# Patient Record
Sex: Male | Born: 1975 | Race: White | Hispanic: No | Smoking: Current every day smoker
Health system: Southern US, Community
[De-identification: ages and names within clinical notes are randomized; demographics above are authoritative.]

---

## 2004-12-06 ENCOUNTER — Emergency Department (HOSPITAL_COMMUNITY): Admission: EM | Admit: 2004-12-06 | Discharge: 2004-12-06 | Payer: Self-pay | Admitting: Emergency Medicine

## 2005-05-23 ENCOUNTER — Emergency Department (HOSPITAL_COMMUNITY): Admission: EM | Admit: 2005-05-23 | Discharge: 2005-05-24 | Payer: Self-pay | Admitting: Emergency Medicine

## 2005-05-24 ENCOUNTER — Ambulatory Visit: Payer: Self-pay | Admitting: Psychiatry

## 2005-05-24 ENCOUNTER — Inpatient Hospital Stay (HOSPITAL_COMMUNITY): Admission: EM | Admit: 2005-05-24 | Discharge: 2005-05-27 | Payer: Self-pay | Admitting: Psychiatry

## 2006-05-22 ENCOUNTER — Emergency Department (HOSPITAL_COMMUNITY): Admission: EM | Admit: 2006-05-22 | Discharge: 2006-05-22 | Payer: Self-pay | Admitting: Emergency Medicine

## 2006-05-22 ENCOUNTER — Ambulatory Visit: Payer: Self-pay | Admitting: Psychiatry

## 2006-05-22 ENCOUNTER — Inpatient Hospital Stay (HOSPITAL_COMMUNITY): Admission: EM | Admit: 2006-05-22 | Discharge: 2006-05-26 | Payer: Self-pay | Admitting: Psychiatry

## 2006-09-15 ENCOUNTER — Emergency Department (HOSPITAL_COMMUNITY): Admission: EM | Admit: 2006-09-15 | Discharge: 2006-09-15 | Payer: Self-pay | Admitting: Emergency Medicine

## 2008-10-01 ENCOUNTER — Inpatient Hospital Stay (HOSPITAL_COMMUNITY): Admission: AD | Admit: 2008-10-01 | Discharge: 2008-10-07 | Payer: Self-pay | Admitting: Psychiatry

## 2008-10-01 ENCOUNTER — Emergency Department (HOSPITAL_COMMUNITY): Admission: EM | Admit: 2008-10-01 | Discharge: 2008-10-01 | Payer: Self-pay | Admitting: Emergency Medicine

## 2008-10-01 ENCOUNTER — Ambulatory Visit: Payer: Self-pay | Admitting: Psychiatry

## 2010-08-25 LAB — DIFFERENTIAL
Basophils Relative: 0 % (ref 0–1)
Eosinophils Absolute: 0.3 10*3/uL (ref 0.0–0.7)
Eosinophils Relative: 3 % (ref 0–5)
Monocytes Absolute: 0.7 10*3/uL (ref 0.1–1.0)
Monocytes Relative: 7 % (ref 3–12)
Neutrophils Relative %: 51 % (ref 43–77)

## 2010-08-25 LAB — COMPREHENSIVE METABOLIC PANEL
ALT: 23 U/L (ref 0–53)
Albumin: 4 g/dL (ref 3.5–5.2)
Alkaline Phosphatase: 78 U/L (ref 39–117)
Glucose, Bld: 110 mg/dL — ABNORMAL HIGH (ref 70–99)
Potassium: 4.2 mEq/L (ref 3.5–5.1)
Sodium: 134 mEq/L — ABNORMAL LOW (ref 135–145)
Total Protein: 6.5 g/dL (ref 6.0–8.3)

## 2010-08-25 LAB — CBC
Hemoglobin: 16 g/dL (ref 13.0–17.0)
RDW: 12.5 % (ref 11.5–15.5)

## 2010-08-25 LAB — RAPID URINE DRUG SCREEN, HOSP PERFORMED: Benzodiazepines: NOT DETECTED

## 2010-08-25 LAB — TRICYCLICS SCREEN, URINE: TCA Scrn: NOT DETECTED

## 2010-08-25 LAB — ETHANOL: Alcohol, Ethyl (B): 333 mg/dL — ABNORMAL HIGH (ref 0–10)

## 2010-10-02 NOTE — Discharge Summary (Signed)
NAME:  Jack Horne, Jack Horne NO.:  [id number]   MEDICAL RECORD NO.:  [id number]          PATIENT TYPE:  IPS   LOCATION:  0508                          FACILITY:  BH   PHYSICIAN:  Geoffery Lyons, M.D.      DATE OF BIRTH:  [birth year]   DATE OF ADMISSION:  10/01/2008  DATE OF DISCHARGE:  10/07/2008                               DISCHARGE SUMMARY   CHIEF COMPLAINT:  This was the second admission to [facility] for this 35 year old male who presented requesting help with  alcohol and cocaine abuse.  Has spent rent money on drugs, drinking  close to 20 beers daily and cocaine weekly and cannot afford to continue  doing what he is doing, feeling that his substance abuse without of  control.   PAST PSYCHIATRIC HISTORY:  [facility] in 2007 and 2008.  Also  [facility].  Longest sobriety 9 months, has abused Xanax, Klonopin,  ecstasy.   MEDICAL HISTORY:  Palpitations.   MEDICATIONS:  None.   PHYSICAL EXAMINATION:  Exam failed to show any acute findings.   LABORATORY WORK:  Alcohol level upon admission 333.  CBC within normal  limits.  Blood chemistry within normal limits.   PHYSICAL EXAMINATION:  Exam failed to show any acute findings.  __________ Exam reveals alert cooperative male.  Mood anxious.  Affect  anxious.  Thought processes logical, coherent and relevant.  Endorsed he  wants help with his substance abuse, cannot continue going on like this,  feels very overwhelmed, no active suicidal ideas, no delusions.  No  hallucinations.  Cognition well-preserved.   ADMISSION DIAGNOSES:  AXIS I: Alcohol dependence, cocaine abuse.  AXIS II: No diagnosis.  AXIS IV: Moderate.  AXIS V: Upon admission 35, GAF in the last year 65.   COURSE IN THE HOSPITAL:  Was admitted.  We pursued detox with Librium.  He also admitted to some opiates and some marijuana use.  He has also  abused stimulants.  Past history of abusing steroids that led to relapse  on  alcohol.  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  His employer urged him to get some help so they are  supportive of him.  Endorsed that the cravings for alcohol were a big  issue in his relapses, did try __________  before and felt that it did  work.  There is a lot of anxiety, irritability, __________ with detox.  We continued to work on relapse prevention, exploring options in terms  of possibility of rehab.  Also he felt that he did well on the  Wellbutrin.  We also address his anxiety, irritability with Neurontin.  He was willing to give all this medications a try.  On May 24, he was in  full contact reality, had made some arrangements.  He was going to be  able to stay in a safe place while waiting for a bed [facility].  Overall he  was improved.  His mood was improving.  His affect was brighter.  Thought processes logical, coherent  and relevant.  Endorsed no suicidal  or homicidal ideations, was hopeful that this was going to work for him.  Endorsed that he has not been this positive state of mind in a long  time.   DISCHARGE DIAGNOSES:  AXIS I: Alcohol dependence, cocaine, opiate,  marijuana abuse, mood disorder NOS.  AXIS II: No diagnosis.  AXIS III.  No diagnosis.  AXIS IV: Moderate.  AXIS V:  On discharge 55-60.  Discharged on __________ 333 mg 2 three  times a day, Neurontin 100 mg 3 times a day, Wellbutrin XL 150 mg in the  morning.  Follow-up through [facility] in [place].      Geoffery Lyons, M.D.  Electronically Signed     IL/MEDQ  D:  10/28/2008  T:  10/28/2008  Job:  [id number]

## 2010-10-02 NOTE — Discharge Summary (Signed)
NAME:  Jack Horne, Jack Horne NO.:  [id number]   MEDICAL RECORD NO.:  [id number]          PATIENT TYPE:  IPS   LOCATION:  0504                          FACILITY:  BH   PHYSICIAN:  Geoffery Lyons, M.D.      DATE OF BIRTH:  1976-04-05   DATE OF ADMISSION:  05/22/2006  DATE OF DISCHARGE:  05/26/2006                               DISCHARGE SUMMARY   CHIEF COMPLAINT AND PRESENT ILLNESS:  This was the second admission to  [facility] for this 35 year old separated white male  voluntarily admitted.  History of polysubstance dependence, drinking  alcohol, using cocaine, using Xanax, Klonopin and ecstasy.  Has been  doing injectable steroids, testosterone for the past four months.  Has  been drinking beer and liquor, up to a case of beer at a time.  Last  drink was May 22, 2006.  Had been drinking in the morning.  Experiencing blackouts.  Denied any seizure activity or falls.  Relapsed  in November of 2007 after being sober for nine months.  Feeling  depressed, no suicidal thoughts, separated from his wife who threw him  out due to his substance abuse.   PAST PSYCHIATRIC HISTORY:  Second time at [facility].  Was in the  unit a year prior to this admission.  Longest sobriety has been nine  months.  Attending AA.   ALCOHOL/DRUG HISTORY:  As already stated, persistent use of multiple  substances.   MEDICAL HISTORY:  Knee problems.   MEDICATIONS:  Wellbutrin XL 300 mg in the morning, trazodone 50 mg, 1-2  at night, Relafen 700 mg twice a day as needed.   PHYSICAL EXAMINATION:  Performed and failed to show any acute findings.   LABORATORY DATA:  CBC with white blood cells 6.7, hemoglobin 15.1.  Blood chemistry with sodium 140, potassium 4.0, glucose 100, BUN 6,  creatinine 1.07.  SGOT 33, SGPT 36, TSH 0.878.   MENTAL STATUS EXAM:  Alert, cooperative male.  Fair eye contact.  Casually dressed.  Speech was clear, normal rate, tempo and production.  Endorsed he feels sad.  Affect constricted.  Thought processes were  clear, goal-oriented, not as spontaneous.  Had degree of psychomotor  retardation but endorsed no active suicidal or homicidal ideation.  No  evidence of delusions.  No hallucinations.  Cognition was well-  preserved.   ADMISSION DIAGNOSES:  AXIS I:  Polysubstance abuse.  Depressive disorder  not otherwise specified.  AXIS II:  No diagnosis.  AXIS III:  Knee problems.  AXIS IV:  Moderate.  AXIS V:  GAF upon admission 35; highest GAF in the last year 70.   HOSPITAL COURSE:  He was admitted.  He was detoxed with Librium as well  as clonidine.  He was given Protonix 40 mg daily, Relafen 750 mg twice a  day, trazodone 50 mg for sleep.  Eventually, he was placed back on  Wellbutrin and he was placed on Campral.  He did endorse multiple  issues.  Was in [facility] for [facility] days.  Nine months abstinence.  Claims that  his wife died, conflict with the wife, separated from the  wife three weeks prior to this admission, was fired from his job.  He  was employed by a family of his wife.  Three kids, ages 39, 93, and 35.  Cocaine every other day, benzodiazepines, steroids.  Has thought about  killing himself.  Depressed, decreased sleep, sadness, unable to cope.  Initially, as already stated, very reserved, very guarded, volunteers  hardly any information.  Admitted that he kept these things to himself.  Endorsed that he needed to change this style.  It was after he started  using cocaine that his wife decided to leave.  Claimed that his wife  might still want to work things out but he endorsed that he had accepted  the fact that she was going to be gone.  Would like to go to an  intensive outpatient program.  Would not want to go to rehab as he  wanted to go out and get a job.  By May 25, 2006, he was evidencing a  broader range of affect.  Family would want him to go into a residential  program but realistically speaking  there was not a place to go.  He  wanted to go and stay with his uncle, find a job, go to the CD IOP  in  [place].  Was going to pursue Campral.  Overall, he was feeling  better, insightful.  Endorsed he did it before and he learned from his  relapse.  He was willing to do it again.  We worked with the Wellbutrin,  relapse prevention, the Campral.  On May 26, 2006, he was in full  contact with reality.  He was fully detoxed.  There were no suicidal or  homicidal ideation.  No hallucinations.  No delusions.  Committed to  abstinence.  Will go to [facility] for their CD IOP program.  Was going to continue the medications.  Going to find a job.  In good  terms with his wife.  Endorsed that he had accepted that she was going  to be gone but, if she was willing to work on the relationship, he was  willing to do so to.   DISCHARGE DIAGNOSES:  AXIS I:  Polysubstance dependence.  Depressive  disorder not otherwise specified.  AXIS II:  No diagnosis.  AXIS III:  No diagnosis.  AXIS IV:  Moderate.  AXIS V:  GAF upon discharge 55-60.   DISCHARGE MEDICATIONS:  1. Wellbutrin XL 300 mg in the morning.  2. Campral 333 mg, 2 three times a day.  3. Protonix 40 mg per day.  4. Trazodone 100 mg at bedtime as needed.  5. Relafen 750 mg twice a day.   FOLLOWUP:  [facility].      Geoffery Lyons, M.D.  Electronically Signed     IL/MEDQ  D:  06/07/2006  T:  06/07/2006  Job:  [id number]

## 2010-10-02 NOTE — H&P (Signed)
NAME:  Jack Horne, Jack Horne NO.:  [id number]   MEDICAL RECORD NO.:  [id number]          PATIENT TYPE:  IPS   LOCATION:  0302                          FACILITY:  BH   PHYSICIAN:  Geoffery Lyons, M.D.      DATE OF BIRTH:  1975/12/30   DATE OF ADMISSION:  05/24/2005  DATE OF DISCHARGE:                         PSYCHIATRIC ADMISSION ASSESSMENT   [name] married white male voluntarily admitted on May 24, 2005.   HISTORY OF PRESENT ILLNESS:  The patient presents with a history of alcohol  abuse.  He has been drinking beer up to 12-18 beers nightly.  His drinking  has been escalating.  He has been drinking for years.  He states his wife is  very tired of his alcohol habits.  His last drink was on May 23, 2005.  He denies any drinking in the morning.  He reports history of blackouts, no  seizure activity.  Patient states that he is tired of drinking and wants to  get help.  He denies any specific stressors.  He denies any suicidal  ideation.   PAST PSYCHIATRIC HISTORY:  First admission to [facility], no  history of being detoxed prior, no history of any suicide attempts.   SOCIAL HISTORY:  This is a 35 year old married white male, married for 2  years with 3 children ages 64, 24 and 57.  He works at [employer].  He has  completed his GED, no legal issues.   FAMILY HISTORY:  Denies any psychiatric history.  He states his uncle and  grandfather have problems with alcohol.   ALCOHOL AND DRUG HISTORY:  Patient smokes.  Alcohol habits as described  above.  He denies any drug use.   PRIMARY CARE Jayelyn Barno:  Unknown.   MEDICAL PROBLEMS:  None.   MEDICATIONS:  None.   DRUG ALLERGIES:  CODEINE.   PHYSICAL EXAMINATION:  Patient was assessed at [facility].  Temperature 98.3, heart rate 83, respirations 20, blood  pressure 134/94, weight 172 pounds, height 5 feet 11-1/2 inches.  __________  are noted.  Urine drug screen is negative.   Hemoglobin 15.3, hematocrit 45.  Alcohol level is 239.  BUN 5.   MENTAL STATUS EXAM:  Alert, cooperative male, fair eye contact, currently  lying in a bed.  Speech is clear, normal pace and tone.  Patient feels very  tired.  Patient is polite and agreeable.  Thought processes are coherent,  well organized, no evidence of psychosis.  Cognitive function intact.  Memory is good.  Judgment is fair. Poor impulse control.  Insight is fair.   ADMISSION DIAGNOSES:  AXIS I:  Alcohol dependence.  AXIS II:  Deferred.  AXIS III:  None.  AXIS IV:  Psychosocial problems related to alcohol use.  AXIS V:  Current is 40, estimated this past year 81.   PLAN:  Detox the patient with Librium protocol.  Monitor withdrawal  symptoms.  Work on relapse prevention.  Patient could benefit from the CD  IOP program and continued support through [employer].  Consider family  session with his wife.      Landry Corporal, N.P.      Geoffery Lyons, M.D.  Electronically Signed    JO/MEDQ  D:  05/25/2005  T:  05/25/2005  Job:  [id number]
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
## 2010-10-02 NOTE — H&P (Signed)
NAME:  Jack Horne, Jack Horne NO.:  [id number]   MEDICAL RECORD NO.:  [id number]          PATIENT TYPE:  IPS   LOCATION:  0504                          FACILITY:  BH   PHYSICIAN:  Geoffery Lyons, M.D.      DATE OF BIRTH:  [birth year]   DATE OF ADMISSION:  05/22/2006  DATE OF DISCHARGE:                       PSYCHIATRIC ADMISSION ASSESSMENT   DIAGNOSES:   AXIS I:  Polysubstance abuse.  Rule out substance-induced mood disorder.   AXIS II:  Deferred.   AXIS III:  Knee problems.   AXIS IV:  Problems with primary support group, occupation, housing  problems, possible problems with economic issues, and psychosocial  problems.   AXIS V:  Current is 35.   [name], separated, white male, voluntarily admitted on May 22, 2006.   HISTORY OF PRESENT ILLNESS:  The patient presents with a history of  polysubstance abuse, drinking alcohol, using cocaine, using Xanax,  Klonopin, and Ecstasy, also has been doing injectable steroids,  testosterone, for the past four months.  He has been drinking beer and  liquor up to a case of beer at a time.  His last drink was on May 22, 2006.  He has been drinking in the morning, experiencing blackouts,  denies any seizure activities or falls.  He states he relapsed in  November of 2007 after being sober for approximately nine months.  He  feels depressed.  He denies any suicidal thoughts.  He states that he is  currently separated from his wife as she threw him out due to his  substance abuse.   PAST PSYCHIATRIC HISTORY:  His second visit.  The patient was here  approximately a year ago for alcohol use.  His longest history of  sobriety has been nine months.  The patient was attending AA meetings,  but states that all it did was make him want to drink more.  He also  reports a history of a suicide gesture where he cut himself.   SOCIAL HISTORY:  He is a 35 year old, separated, white male, separated  for three weeks, married  for two-and-a-half years.  This is his second  marriage.  He has three children, 16, 57, and 59 years of age.  The  patient is currently living with his uncle, who resides in [place].  The  patient was fired from his job.  He was working in his [employer]  as a Designer, industrial/product, but because of his substance abuse has not been  working since December of 2007.  The patient denies any legal issues.   FAMILY HISTORY:  He reports some depression on his mother's side.   ALCOHOL AND DRUG HISTORY:  The patient smokes two-and-a-half packs a  day.  Alcohol and drug habits as described above.   PRIMARY CARE Wilfredo Canterbury:  Dr. Cliffton Asters at [facility].   MEDICAL PROBLEMS:  Knee problems.   MEDICATIONS:  1. Wellbutrin-XL 300 mg for approximately a year.  He reports      compliance.  2. Trazodone 50 mg taking one to two at bedtime.  3. Relafen  750 mg one twice a day p.r.n. for knee pain.  He states he      normally only takes one a day if he needs it.   DRUG ALLERGIES:  CODEINE.  He is unclear of the reaction to that  medication.   Patient was assessed at [facility].  He did receive  Ativan 10 mg.  Temperature is 98.3, 98 heart rate, 20 respirations,  blood pressure 120/71, 96% saturated.  His CMET was within normal  limits.  His BUN was less than 3.  Alcohol level was 212.  Urine drug  screen was positive for cocaine and positive for benzodiazepines.  His  TSH was 0.878.   MENTAL STATUS EXAMINATION:  Fully alert, cooperative, fair eye contact.  He is casually dressed.  Speech is clear with normal rate and tone.  The  patient feels sad.  The patient also appears sad.  Thought processes are  coherent.  No evidence of psychosis.  Concentration intact.  Memory is  good.  Judgment is poor.  Insight is fair.  Poor impulse control.   PLAN:  Contract for safety, stabilize mood, __________.  We will detox  the patient off of substances, work on relapse prevention.   The patient  at this time is not interested in a rehab program or AA as he needs to  find a job and get back to taking care of his children.  He states he  will continue to live with his uncle for some time.  We will also hold  Wellbutrin at this time due to some questionable noncompliance with the  medication and the risk of seizure.  We will have Trazodone for sleep.   TENTATIVE LENGTH OF STAY:  Three to four days.      Landry Corporal, N.P.    ______________________________  Geoffery Lyons, M.D.    JO/MEDQ  D:  05/23/2006  T:  05/23/2006  Job:  [id number]

## 2016-04-11 ENCOUNTER — Inpatient Hospital Stay (HOSPITAL_COMMUNITY): Payer: BLUE CROSS/BLUE SHIELD

## 2016-04-11 ENCOUNTER — Emergency Department (HOSPITAL_COMMUNITY): Payer: BLUE CROSS/BLUE SHIELD

## 2016-04-11 ENCOUNTER — Encounter (HOSPITAL_COMMUNITY)
Admission: EM | Disposition: A | Discharge status: Other Acute Inpatient Hospital | Payer: Self-pay | Source: Home / Self Care

## 2016-04-11 ENCOUNTER — Encounter (HOSPITAL_COMMUNITY): Payer: Self-pay | Admitting: Radiology

## 2016-04-11 ENCOUNTER — Inpatient Hospital Stay (HOSPITAL_COMMUNITY): Payer: BLUE CROSS/BLUE SHIELD | Admitting: Anesthesiology

## 2016-04-11 ENCOUNTER — Inpatient Hospital Stay (HOSPITAL_COMMUNITY)
Admission: EM | Admit: 2016-04-11 | Discharge: 2016-04-12 | DRG: 083 | Disposition: A | Discharge status: Other Acute Inpatient Hospital | Payer: BLUE CROSS/BLUE SHIELD | Attending: Neurosurgery | Admitting: Neurosurgery

## 2016-04-11 DIAGNOSIS — S02609A Fracture of mandible, unspecified, initial encounter for closed fracture: Secondary | ICD-10-CM | POA: Diagnosis present

## 2016-04-11 DIAGNOSIS — S0532XA Ocular laceration without prolapse or loss of intraocular tissue, left eye, initial encounter: Secondary | ICD-10-CM | POA: Diagnosis present

## 2016-04-11 DIAGNOSIS — W3400XA Accidental discharge from unspecified firearms or gun, initial encounter: Secondary | ICD-10-CM

## 2016-04-11 DIAGNOSIS — S065X9A Traumatic subdural hemorrhage with loss of consciousness of unspecified duration, initial encounter: Secondary | ICD-10-CM | POA: Diagnosis present

## 2016-04-11 DIAGNOSIS — S066X9A Traumatic subarachnoid hemorrhage with loss of consciousness of unspecified duration, initial encounter: Secondary | ICD-10-CM | POA: Diagnosis present

## 2016-04-11 DIAGNOSIS — S0240FA Zygomatic fracture, left side, initial encounter for closed fracture: Secondary | ICD-10-CM | POA: Diagnosis present

## 2016-04-11 DIAGNOSIS — S0240DA Maxillary fracture, left side, initial encounter for closed fracture: Secondary | ICD-10-CM | POA: Diagnosis present

## 2016-04-11 DIAGNOSIS — S022XXA Fracture of nasal bones, initial encounter for closed fracture: Secondary | ICD-10-CM | POA: Diagnosis present

## 2016-04-11 DIAGNOSIS — G9389 Other specified disorders of brain: Secondary | ICD-10-CM | POA: Diagnosis present

## 2016-04-11 DIAGNOSIS — Y249XXA Unspecified firearm discharge, undetermined intent, initial encounter: Secondary | ICD-10-CM

## 2016-04-11 DIAGNOSIS — F17210 Nicotine dependence, cigarettes, uncomplicated: Secondary | ICD-10-CM | POA: Diagnosis present

## 2016-04-11 DIAGNOSIS — S0292XB Unspecified fracture of facial bones, initial encounter for open fracture: Secondary | ICD-10-CM

## 2016-04-11 DIAGNOSIS — S0219XA Other fracture of base of skull, initial encounter for closed fracture: Secondary | ICD-10-CM | POA: Diagnosis present

## 2016-04-11 LAB — I-STAT ARTERIAL BLOOD GAS, ED
ACID-BASE DEFICIT: 12 mmol/L — AB (ref 0.0–2.0)
BICARBONATE: 16.3 mmol/L — AB (ref 20.0–28.0)
O2 SAT: 99 %
PO2 ART: 194 mmHg — AB (ref 83.0–108.0)
TCO2: 18 mmol/L (ref 0–100)
pCO2 arterial: 48.1 mmHg — ABNORMAL HIGH (ref 32.0–48.0)
pH, Arterial: 7.134 — CL (ref 7.350–7.450)

## 2016-04-11 LAB — TYPE AND SCREEN
ABO/RH(D): O POS
Antibody Screen: NEGATIVE
UNIT DIVISION: 0
Unit division: 0

## 2016-04-11 LAB — COMPREHENSIVE METABOLIC PANEL
ALT: 22 U/L (ref 17–63)
ANION GAP: 12 (ref 5–15)
AST: 35 U/L (ref 15–41)
Albumin: 3.7 g/dL (ref 3.5–5.0)
Alkaline Phosphatase: 44 U/L (ref 38–126)
BUN: 5 mg/dL — ABNORMAL LOW (ref 6–20)
CALCIUM: 7.7 mg/dL — AB (ref 8.9–10.3)
CHLORIDE: 103 mmol/L (ref 101–111)
CO2: 18 mmol/L — AB (ref 22–32)
Creatinine, Ser: 0.74 mg/dL (ref 0.61–1.24)
GFR calc non Af Amer: >60 mL/min (ref >60)
Glucose, Bld: 129 mg/dL — ABNORMAL HIGH (ref 65–99)
POTASSIUM: 3.3 mmol/L — AB (ref 3.5–5.1)
SODIUM: 133 mmol/L — AB (ref 135–145)
Total Bilirubin: 0.1 mg/dL — ABNORMAL LOW (ref 0.3–1.2)
Total Protein: 6 g/dL — ABNORMAL LOW (ref 6.5–8.1)

## 2016-04-11 LAB — URINALYSIS, ROUTINE W REFLEX MICROSCOPIC
Bilirubin Urine: NEGATIVE
GLUCOSE, UA: NEGATIVE mg/dL
Hgb urine dipstick: NEGATIVE
Ketones, ur: NEGATIVE mg/dL
LEUKOCYTES UA: NEGATIVE
Nitrite: NEGATIVE
PROTEIN: NEGATIVE mg/dL
Specific Gravity, Urine: 1.005 (ref 1.005–1.030)
pH: 5 (ref 5.0–8.0)

## 2016-04-11 LAB — I-STAT CHEM 8, ED
BUN: 4 mg/dL — ABNORMAL LOW (ref 6–20)
Calcium, Ion: 0.98 mmol/L — ABNORMAL LOW (ref 1.15–1.40)
Chloride: 102 mmol/L (ref 101–111)
Creatinine, Ser: 0.9 mg/dL (ref 0.61–1.24)
Glucose, Bld: 128 mg/dL — ABNORMAL HIGH (ref 65–99)
HEMATOCRIT: 40 % (ref 39.0–52.0)
HEMOGLOBIN: 13.6 g/dL (ref 13.0–17.0)
POTASSIUM: 3.4 mmol/L — AB (ref 3.5–5.1)
SODIUM: 135 mmol/L (ref 135–145)
TCO2: 18 mmol/L (ref 0–100)

## 2016-04-11 LAB — CBC
HEMATOCRIT: 38.1 % — AB (ref 39.0–52.0)
HEMOGLOBIN: 13.4 g/dL (ref 13.0–17.0)
MCH: 31.5 pg (ref 26.0–34.0)
MCHC: 35.2 g/dL (ref 30.0–36.0)
MCV: 89.6 fL (ref 78.0–100.0)
Platelets: 350 10*3/uL (ref 150–400)
RBC: 4.25 MIL/uL (ref 4.22–5.81)
RDW: 13.2 % (ref 11.5–15.5)
WBC: 20.9 10*3/uL — ABNORMAL HIGH (ref 4.0–10.5)

## 2016-04-11 LAB — PROTIME-INR
INR: 1.05
PROTHROMBIN TIME: 13.7 s (ref 11.4–15.2)

## 2016-04-11 LAB — PREPARE FRESH FROZEN PLASMA
UNIT DIVISION: 0
Unit division: 0

## 2016-04-11 LAB — I-STAT CG4 LACTIC ACID, ED: LACTIC ACID, VENOUS: 3.21 mmol/L — AB (ref 0.5–1.9)

## 2016-04-11 LAB — TRIGLYCERIDES: TRIGLYCERIDES: 244 mg/dL — AB (ref <150)

## 2016-04-11 LAB — ETHANOL: ALCOHOL ETHYL (B): 281 mg/dL — AB (ref <5)

## 2016-04-11 SURGERY — CRANIOTOMY HEMATOMA EVACUATION SUBDURAL
Anesthesia: General | Site: Head | Laterality: Left

## 2016-04-11 MED ORDER — PROPOFOL 10 MG/ML IV BOLUS
INTRAVENOUS | Status: AC | PRN
  Administered 2016-04-11: 10 mg via INTRAVENOUS

## 2016-04-11 MED ORDER — MIDAZOLAM HCL 2 MG/2ML IJ SOLN
INTRAMUSCULAR | Status: AC
  Filled 2016-04-11: qty 4

## 2016-04-11 MED ORDER — PROPOFOL 1000 MG/100ML IV EMUL
INTRAVENOUS | Status: AC
  Filled 2016-04-11: qty 100

## 2016-04-11 MED ORDER — FENTANYL 2500MCG IN NS 250ML (10MCG/ML) PREMIX INFUSION
100.0000 ug/h | INTRAVENOUS | Status: DC
  Administered 2016-04-11: 50 ug/h via INTRAVENOUS
  Filled 2016-04-11: qty 250

## 2016-04-11 MED ORDER — SODIUM CHLORIDE 0.9 % IV SOLN
1.0000 mg/h | INTRAVENOUS | Status: DC
  Administered 2016-04-11: 1 mg/h via INTRAVENOUS
  Filled 2016-04-11: qty 10

## 2016-04-11 MED ORDER — ONDANSETRON HCL 4 MG/2ML IJ SOLN: 4.0000 mg | Freq: Four times a day (QID) | INTRAMUSCULAR | Status: DC | PRN

## 2016-04-11 MED ORDER — KCL IN DEXTROSE-NACL 20-5-0.9 MEQ/L-%-% IV SOLN: INTRAVENOUS | Status: DC

## 2016-04-11 MED ORDER — FENTANYL CITRATE (PF) 100 MCG/2ML IJ SOLN
INTRAMUSCULAR | Status: AC
  Filled 2016-04-11: qty 2

## 2016-04-11 MED ORDER — MIDAZOLAM HCL 2 MG/2ML IJ SOLN
INTRAMUSCULAR | Status: AC
  Administered 2016-04-11: 4 mg via INTRAVENOUS
  Filled 2016-04-11: qty 4

## 2016-04-11 MED ORDER — MIDAZOLAM HCL 5 MG/5ML IJ SOLN
INTRAMUSCULAR | Status: AC | PRN
  Administered 2016-04-11 (×2): 4 mg via INTRAVENOUS

## 2016-04-11 MED ORDER — ONDANSETRON HCL 4 MG PO TABS: 4.0000 mg | ORAL_TABLET | Freq: Four times a day (QID) | ORAL | Status: DC | PRN

## 2016-04-11 MED ORDER — CEFAZOLIN SODIUM-DEXTROSE 2-4 GM/100ML-% IV SOLN
INTRAVENOUS | Status: DC
Start: 2016-04-11 — End: 2016-04-12
  Filled 2016-04-11: qty 100

## 2016-04-11 MED ORDER — MIDAZOLAM HCL 2 MG/2ML IJ SOLN
4.0000 mg | INTRAMUSCULAR | Status: DC | PRN
  Administered 2016-04-11: 4 mg via INTRAVENOUS

## 2016-04-11 MED ORDER — PROPOFOL 1000 MG/100ML IV EMUL
5.0000 ug/kg/min | INTRAVENOUS | Status: DC
  Administered 2016-04-11: 10 ug/kg/min via INTRAVENOUS

## 2016-04-11 MED ORDER — SUCCINYLCHOLINE CHLORIDE 20 MG/ML IJ SOLN
INTRAMUSCULAR | Status: AC | PRN
  Administered 2016-04-11: 100 mg via INTRAVENOUS

## 2016-04-11 MED ORDER — ERYTHROMYCIN 5 MG/GM OP OINT: TOPICAL_OINTMENT | Freq: Four times a day (QID) | OPHTHALMIC | Status: DC

## 2016-04-11 MED ORDER — ETOMIDATE 2 MG/ML IV SOLN
INTRAVENOUS | Status: AC | PRN
  Administered 2016-04-11: 20 mg via INTRAVENOUS

## 2016-04-11 MED ORDER — CEFAZOLIN SODIUM-DEXTROSE 2-4 GM/100ML-% IV SOLN: 2.0000 g | Freq: Once | INTRAVENOUS | Status: DC

## 2016-04-11 MED ORDER — HYDROMORPHONE HCL 2 MG/ML IJ SOLN: 1.0000 mg | INTRAMUSCULAR | Status: DC | PRN

## 2016-04-11 MED ORDER — IOPAMIDOL (ISOVUE-300) INJECTION 61%
INTRAVENOUS | Status: AC
  Administered 2016-04-11: 75 mL
  Filled 2016-04-11: qty 75

## 2016-04-11 MED ORDER — FENTANYL CITRATE (PF) 100 MCG/2ML IJ SOLN
INTRAMUSCULAR | Status: AC | PRN
  Administered 2016-04-11 (×2): 100 ug via INTRAVENOUS

## 2016-04-11 NOTE — ED Notes (Signed)
bolused 10mg  iv brooke
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
## 2016-04-11 NOTE — ED Notes (Signed)
Pt returned from ct

## 2016-04-11 NOTE — ED Notes (Signed)
Propofol not sedating the pt

## 2016-04-11 NOTE — ED Notes (Signed)
Roc 50 mg given by dr Hyacinth Meekermiller

## 2016-04-11 NOTE — ED Notes (Signed)
Rt pupil 2 and reactive  Lt eye swollen and full of clots
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
## 2016-04-11 NOTE — ED Notes (Signed)
EDP notified of bradycardic periods.

## 2016-04-11 NOTE — Progress Notes (Signed)
   04/11/16 2300  Clinical Encounter Type  Visited With Family  Visit Type Follow-up;Spiritual support;Critical Care;Trauma  Spiritual Encounters  Spiritual Needs Prayer;Grief support;Emotional  [facility] called to escort family for MD notification; [facility] Conference room[facility] as well [facility] family[facility]; [facility] when [facility]. Erline LevineMichael I Marcelle Hepner 11:17 PM

## 2016-04-11 NOTE — ED Notes (Addendum)
To ct

## 2016-04-11 NOTE — ED Notes (Signed)
Intubated by dr Hyacinth Meekermiller

## 2016-04-11 NOTE — ED Notes (Signed)
Propofol 20mg  bloused for sedation
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
## 2016-04-11 NOTE — ED Notes (Signed)
500 ml iv fluid enroute here in the ambulnce
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
## 2016-04-11 NOTE — ED Notes (Signed)
Dr Lazarus Salineswolicki and dr Lovell Sheehanjenkins  Examining the pt.
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
## 2016-04-11 NOTE — ED Notes (Signed)
Rhino-rockets placed in each nostril by dr Lazarus Salineswolicki
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
## 2016-04-11 NOTE — ED Notes (Signed)
Dr Lazarus Salineswolicki at the bedside
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
## 2016-04-12 ENCOUNTER — Encounter: Payer: Self-pay | Admitting: Ophthalmology

## 2016-04-12 LAB — ABO/RH: ABO/RH(D): O POS

## 2016-04-12 LAB — CDS SEROLOGY

## 2016-04-12 LAB — BLOOD PRODUCT ORDER (VERBAL) VERIFICATION
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
